# Patient Record
Sex: Male | Born: 1945 | Race: White | Hispanic: No | Marital: Single | Smoking: Former smoker
Health system: Southern US, Community
[De-identification: ages and names within clinical notes are randomized; demographics above are authoritative.]

## PROBLEM LIST (undated history)

## (undated) DIAGNOSIS — I10 Essential (primary) hypertension: Secondary | ICD-10-CM

## (undated) DIAGNOSIS — R002 Palpitations: Secondary | ICD-10-CM

## (undated) HISTORY — DX: Palpitations: R00.2

## (undated) HISTORY — DX: Essential (primary) hypertension: I10

## (undated) HISTORY — PX: DENTAL SURGERY: SHX609

---

## 2002-05-30 ENCOUNTER — Emergency Department (HOSPITAL_COMMUNITY): Admission: EM | Admit: 2002-05-30 | Discharge: 2002-05-30 | Payer: Self-pay | Admitting: *Deleted

## 2003-12-21 ENCOUNTER — Ambulatory Visit (HOSPITAL_COMMUNITY): Admission: RE | Admit: 2003-12-21 | Discharge: 2003-12-21 | Payer: Self-pay | Admitting: Gastroenterology

## 2012-01-02 ENCOUNTER — Ambulatory Visit (INDEPENDENT_AMBULATORY_CARE_PROVIDER_SITE_OTHER): Payer: Medicare Other | Admitting: Cardiovascular Disease

## 2012-01-02 VITALS — BP 133/83 | HR 76 | Ht 70.0 in | Wt 162.0 lb

## 2012-01-02 DIAGNOSIS — R002 Palpitations: Secondary | ICD-10-CM

## 2012-01-02 NOTE — Progress Notes (Signed)
   History of Present Illness: 66 yo WM with history of HTN, palpitations here today as a new patient for evaluation of palpitations. He tells me that he first noticed palpitations about five years ago. This feels like his heart stops briefly and then pauses and then he feels fine. The last instance of this was 2 months ago. The episodes last for 30 seconds to 1 minute. He is not known to have cardiac disease.   Primary Care Physician: Benedetto Goad [facility] Oscar La PA-C)  Last Lipid Profile: Followed in primary care.   Past Medical History  Diagnosis Date  . Hypertension   . Chronic kidney disease     Past Surgical History  Procedure Date  . Dental surgery     Current Outpatient Prescriptions  Medication Sig Dispense Refill  . aspirin 81 MG tablet Take 81 mg by mouth daily.      . calcium carbonate 200 MG capsule PT takes 500 mg daily      . fish oil-omega-3 fatty acids 1000 MG capsule PT takes 1200mg  daily      . lisinopril (PRINIVIL,ZESTRIL) 20 MG tablet Take 20 mg by mouth daily.      . Multiple Vitamin (MULTIVITAMIN) capsule Take 1 capsule by mouth daily.        No Known Allergies  History   Social History  . Marital Status: Single    Spouse Name: N/A    Number of Children: N/A  . Years of Education: N/A   Occupational History  . Not on file.   Social History Main Topics  . Smoking status: Former Games developer  . Smokeless tobacco: Not on file   Comment: quit 30 years ago  . Alcohol Use: Not on file  . Drug Use: Not on file  . Sexually Active: Not on file   Other Topics Concern  . Not on file   Social History Narrative  . No narrative on file    Family History  Problem Relation Age of Onset  . Cancer Mother   . Heart attack Father     Review of Systems:  As stated in the HPI and otherwise negative.   BP 133/83  Pulse 76  Ht 5\' 10"  (1.778 m)  Wt 162 lb (73.483 kg)  BMI 23.24 kg/m2  Physical Examination: General: Well developed, well nourished,  NAD HEENT: OP clear, mucus membranes moist SKIN: warm, dry. No rashes. Neuro: No focal deficits Musculoskeletal: Muscle strength 5/5 all ext Psychiatric: Mood and affect normal Neck: No JVD, no carotid bruits, no thyromegaly, no lymphadenopathy. Lungs:Clear bilaterally, no wheezes, rhonci, crackles Cardiovascular: Regular rate and rhythm. No murmurs, gallops or rubs. Abdomen:Soft. Bowel sounds present. Non-tender.  Extremities: No lower extremity edema. Pulses are 2 + in the bilateral DP/PT.  EKG: NSR, rate 82 bpm.

## 2012-01-02 NOTE — Patient Instructions (Signed)

## 2012-01-02 NOTE — Assessment & Plan Note (Signed)
Rare palpitations that only last for 30 seconds. None over last month. Likely premature beats. EKG is normal today. Exam is normal. Will arrange an echocardiogram to exclude structural heart disease. If he has recurrence of frequent palpitations, will arrange monitor at that time. Avoid stimulants such as caffeine.

## 2012-01-08 NOTE — Progress Notes (Signed)
Addended by: Judithe Modest D on: 01/08/2012 10:04 AM   Modules accepted: Orders

## 2012-01-16 ENCOUNTER — Other Ambulatory Visit: Payer: Self-pay

## 2012-01-16 ENCOUNTER — Ambulatory Visit (HOSPITAL_COMMUNITY): Payer: Medicare Other | Attending: Cardiovascular Disease

## 2012-01-16 DIAGNOSIS — R002 Palpitations: Secondary | ICD-10-CM | POA: Insufficient documentation

## 2012-01-16 DIAGNOSIS — I10 Essential (primary) hypertension: Secondary | ICD-10-CM | POA: Insufficient documentation

## 2014-05-21 ENCOUNTER — Other Ambulatory Visit: Payer: Self-pay | Admitting: Family Medicine

## 2014-05-21 DIAGNOSIS — N644 Mastodynia: Secondary | ICD-10-CM

## 2014-05-21 DIAGNOSIS — N63 Unspecified lump in unspecified breast: Secondary | ICD-10-CM

## 2014-05-27 ENCOUNTER — Ambulatory Visit
Admission: RE | Admit: 2014-05-27 | Discharge: 2014-05-27 | Disposition: A | Discharge status: Home / Self Care | Payer: Medicare PPO | Source: Ambulatory Visit | Attending: Family Medicine | Admitting: Family Medicine

## 2014-05-27 DIAGNOSIS — N63 Unspecified lump in unspecified breast: Secondary | ICD-10-CM

## 2014-05-27 DIAGNOSIS — N644 Mastodynia: Secondary | ICD-10-CM

## 2016-05-02 ENCOUNTER — Other Ambulatory Visit: Payer: Self-pay | Admitting: Family Medicine

## 2016-05-02 DIAGNOSIS — R748 Abnormal levels of other serum enzymes: Secondary | ICD-10-CM

## 2016-05-10 ENCOUNTER — Ambulatory Visit
Admission: RE | Admit: 2016-05-10 | Discharge: 2016-05-10 | Disposition: A | Discharge status: Home / Self Care | Payer: Medicare Other | Source: Ambulatory Visit | Attending: Family Medicine | Admitting: Family Medicine

## 2016-05-10 DIAGNOSIS — R748 Abnormal levels of other serum enzymes: Secondary | ICD-10-CM

## 2017-01-11 ENCOUNTER — Other Ambulatory Visit: Payer: Self-pay | Admitting: Family Medicine

## 2017-01-11 DIAGNOSIS — Z87891 Personal history of nicotine dependence: Secondary | ICD-10-CM

## 2017-01-16 ENCOUNTER — Ambulatory Visit: Payer: Medicare Other

## 2017-01-22 ENCOUNTER — Ambulatory Visit: Payer: Medicare Other

## 2017-07-03 ENCOUNTER — Other Ambulatory Visit: Payer: Self-pay | Admitting: Family Medicine

## 2017-07-03 DIAGNOSIS — Z0000 Encounter for general adult medical examination without abnormal findings: Secondary | ICD-10-CM

## 2017-07-06 ENCOUNTER — Ambulatory Visit
Admission: RE | Admit: 2017-07-06 | Discharge: 2017-07-06 | Disposition: A | Discharge status: Home / Self Care | Payer: Medicare Other | Source: Ambulatory Visit | Attending: Family Medicine | Admitting: Family Medicine

## 2017-07-06 DIAGNOSIS — Z0000 Encounter for general adult medical examination without abnormal findings: Secondary | ICD-10-CM

## 2019-08-25 IMAGING — US US CAROTID DUPLEX BILAT
1 series · 13 of 24 positions shown · non-contrast
Comparison: None.

CLINICAL DATA: Carotid artery screening.  History of hypertension.

EXAM:
BILATERAL CAROTID DUPLEX ULTRASOUND
TECHNIQUE: Gray scale imaging, color Doppler and duplex ultrasound were
performed of bilateral carotid and vertebral arteries in the neck.

[Series 1: us carotid duplex bilat · 0.06mm/px · 13 of 70 slices shown]
[im 1/70]
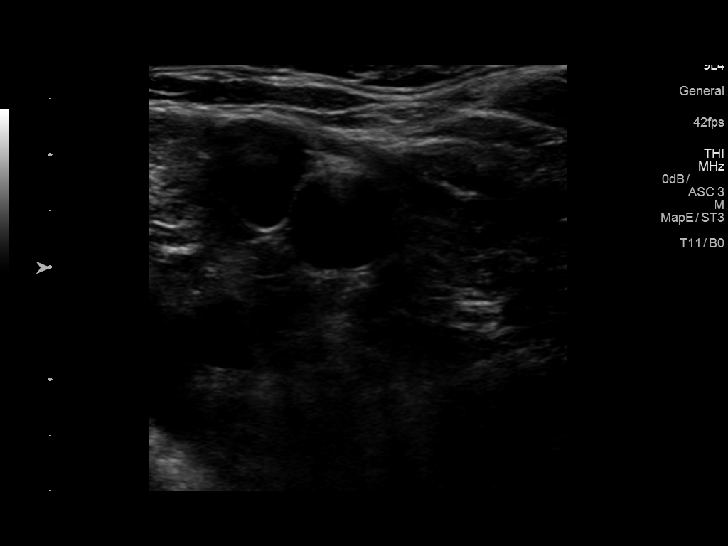
[im 7/70]
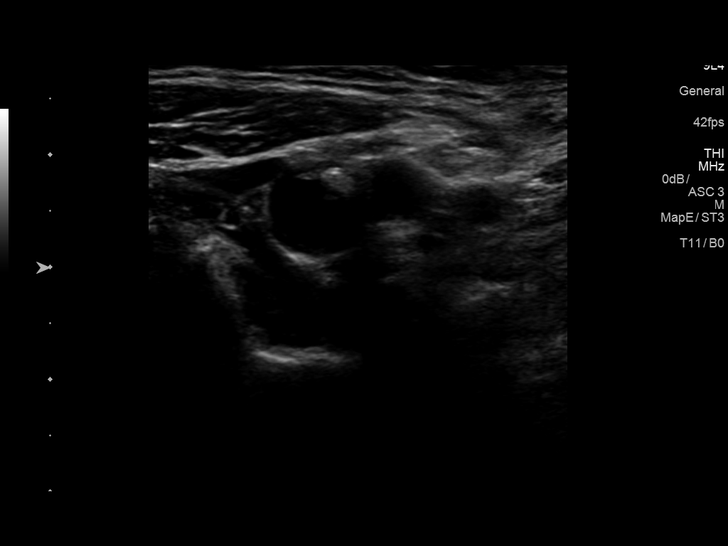
[im 13/70]
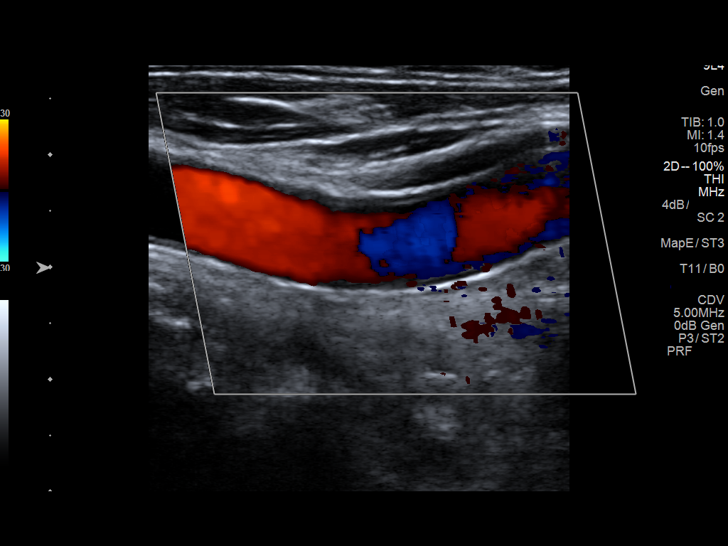
[im 19/70]
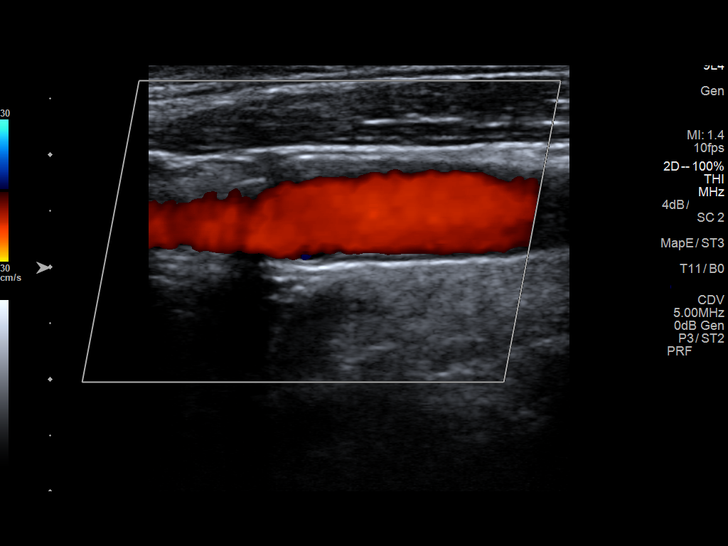
[im 25/70]
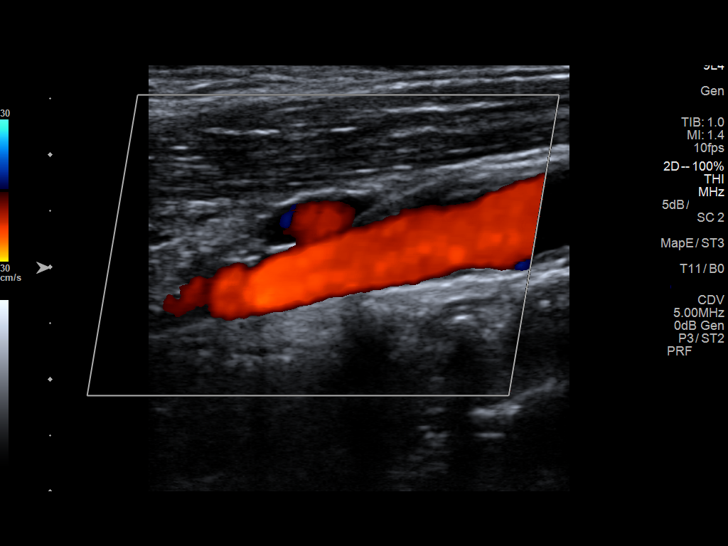
[im 31/70]
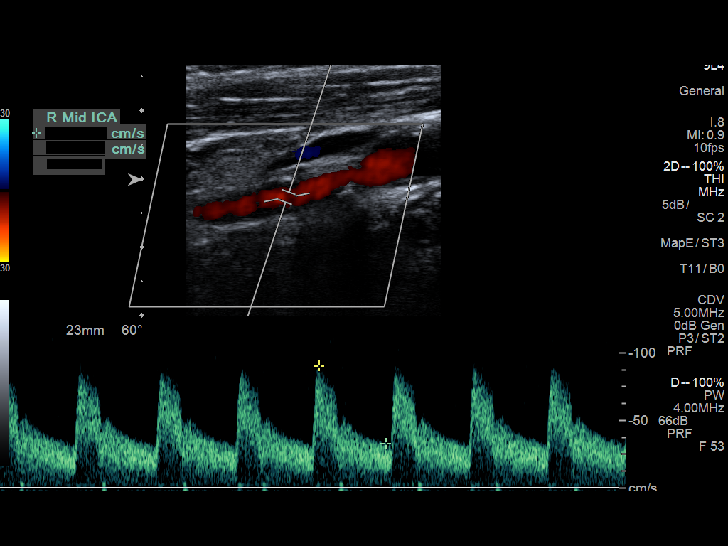
[im 37/70]
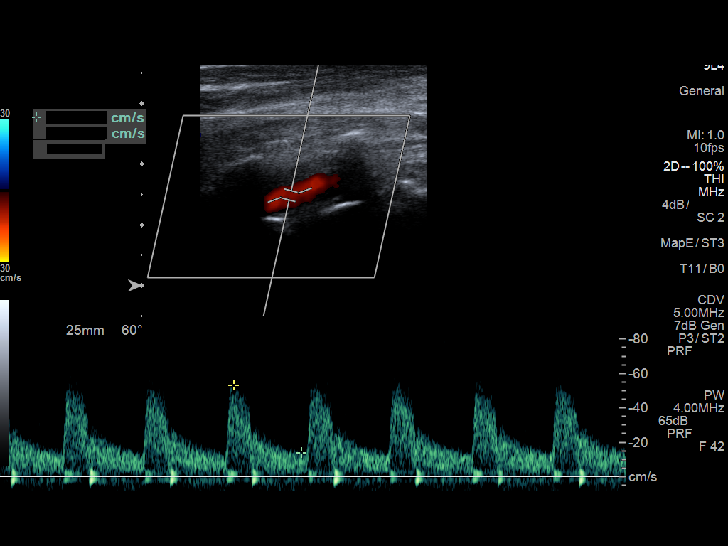
[im 40/70]
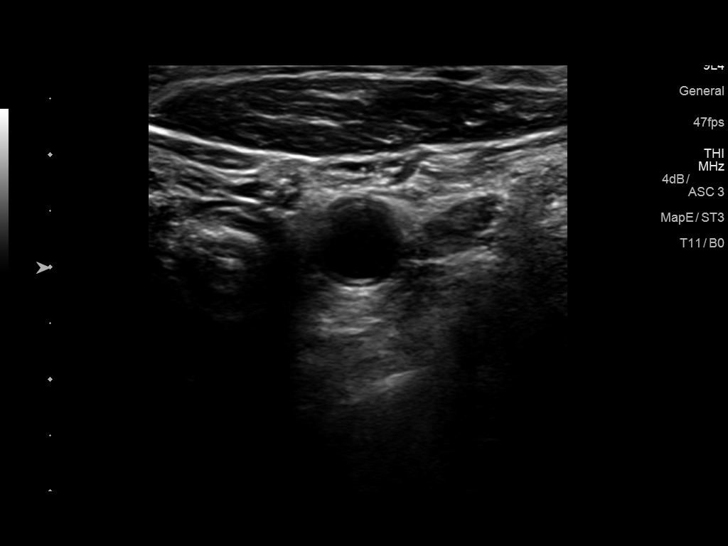
[im 46/70]
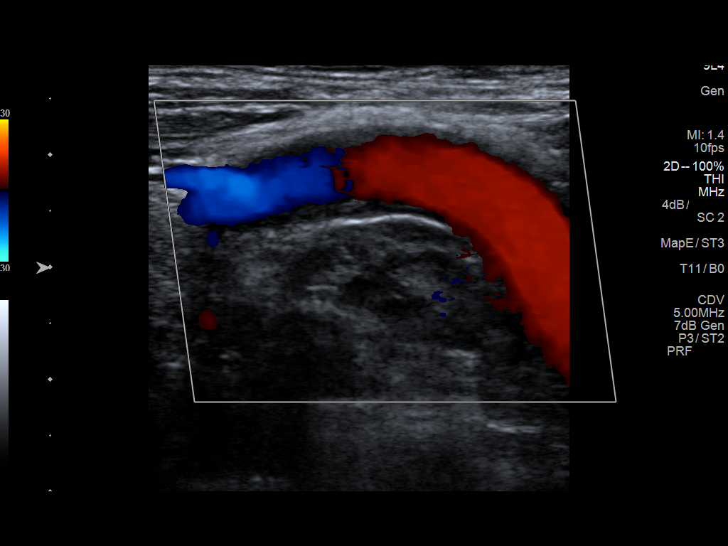
[im 52/70]
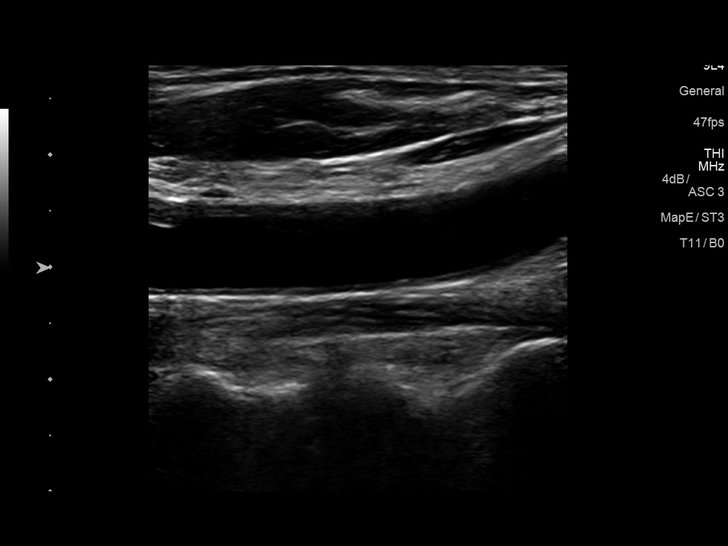
[im 58/70]
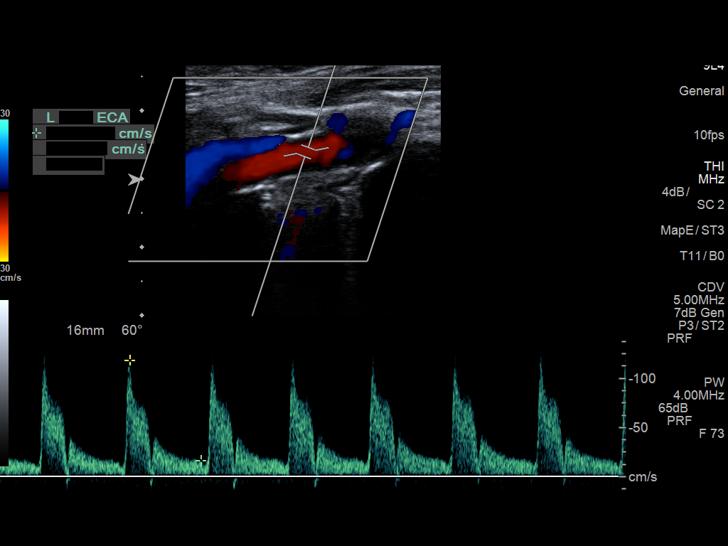
[im 64/70]
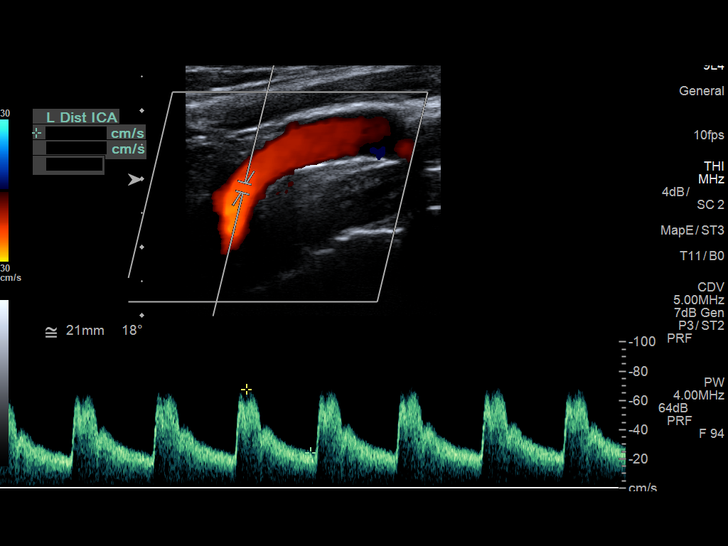
[im 70/70]
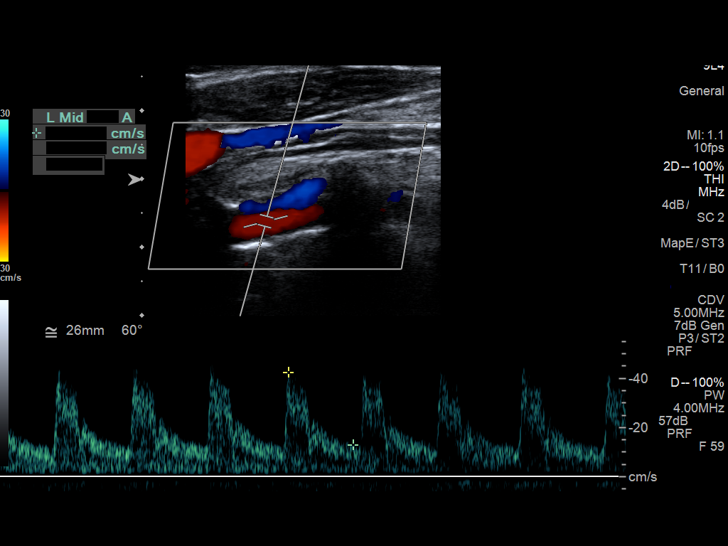

[13 of 24 positions shown; findings below may reference images not displayed]

FINDINGS: Criteria: Quantification of carotid stenosis is based on velocity
parameters that correlate the residual internal carotid diameter
with NASCET-based stenosis levels, using the diameter of the distal
internal carotid lumen as the denominator for stenosis measurement.

The following velocity measurements were obtained:

RIGHT

ICA:  92 cm/sec

CCA:  84 cm/sec

SYSTOLIC ICA/CCA RATIO:

DIASTOLIC ICA/CCA RATIO:

ECA:  105 cm/sec

LEFT

ICA:  76 cm/sec

CCA:  87 cm/sec

SYSTOLIC ICA/CCA RATIO:

DIASTOLIC ICA/CCA RATIO:

ECA:  119 cm/sec

RIGHT CAROTID ARTERY: Minimal plaque at the right carotid bulb.
External carotid artery is patent with normal waveform. Normal
waveforms and velocities in the internal carotid artery. Small
amount of echogenic plaque in the internal carotid artery.

RIGHT VERTEBRAL ARTERY: Antegrade flow and normal waveform in the
right vertebral artery.

LEFT CAROTID ARTERY: Small amount of plaque at the left carotid
bulb. External carotid artery is patent with normal waveform. Small
amount of echogenic plaque in the internal carotid artery. Normal
waveforms and velocities in the internal carotid artery.

LEFT VERTEBRAL ARTERY: Antegrade flow and normal waveform in the
left vertebral artery.
IMPRESSION: Mild atherosclerotic disease in the carotid arteries. Estimated
degree of stenosis in the internal carotid arteries is less than 50%
bilaterally.

Patent vertebral arteries with antegrade flow.

## 2022-08-28 ENCOUNTER — Other Ambulatory Visit: Payer: Self-pay | Admitting: Family Medicine

## 2022-08-28 DIAGNOSIS — R7989 Other specified abnormal findings of blood chemistry: Secondary | ICD-10-CM

## 2022-08-31 ENCOUNTER — Ambulatory Visit
Admission: RE | Admit: 2022-08-31 | Discharge: 2022-08-31 | Disposition: A | Discharge status: Home / Self Care | Payer: Medicare PPO | Source: Ambulatory Visit | Attending: Family Medicine | Admitting: Family Medicine

## 2022-08-31 DIAGNOSIS — R7989 Other specified abnormal findings of blood chemistry: Secondary | ICD-10-CM
